# Patient Record
Sex: Male | Born: 1985 | Race: White | Hispanic: No | Marital: Married | Smoking: Never smoker
Health system: Southern US, Community
[De-identification: ages and names within clinical notes are randomized; demographics above are authoritative.]

---

## 2017-01-03 ENCOUNTER — Encounter (HOSPITAL_COMMUNITY): Payer: Self-pay

## 2017-01-03 ENCOUNTER — Emergency Department (HOSPITAL_COMMUNITY): Payer: BLUE CROSS/BLUE SHIELD

## 2017-01-03 ENCOUNTER — Emergency Department (HOSPITAL_COMMUNITY)
Admission: EM | Admit: 2017-01-03 | Discharge: 2017-01-03 | Disposition: A | Discharge status: Home / Self Care | Payer: BLUE CROSS/BLUE SHIELD | Attending: Emergency Medicine | Admitting: Emergency Medicine

## 2017-01-03 DIAGNOSIS — N23 Unspecified renal colic: Secondary | ICD-10-CM | POA: Diagnosis not present

## 2017-01-03 DIAGNOSIS — N50812 Left testicular pain: Secondary | ICD-10-CM | POA: Insufficient documentation

## 2017-01-03 DIAGNOSIS — R1032 Left lower quadrant pain: Secondary | ICD-10-CM | POA: Diagnosis present

## 2017-01-03 LAB — COMPREHENSIVE METABOLIC PANEL
ALBUMIN: 4.3 g/dL (ref 3.5–5.0)
ALK PHOS: 52 U/L (ref 38–126)
ALT: 15 U/L — AB (ref 17–63)
AST: 19 U/L (ref 15–41)
Anion gap: 11 (ref 5–15)
BUN: 9 mg/dL (ref 6–20)
CHLORIDE: 103 mmol/L (ref 101–111)
CO2: 24 mmol/L (ref 22–32)
CREATININE: 1.23 mg/dL (ref 0.61–1.24)
Calcium: 8.8 mg/dL — ABNORMAL LOW (ref 8.9–10.3)
GFR calc non Af Amer: >60 mL/min (ref >60)
GLUCOSE: 127 mg/dL — AB (ref 65–99)
Potassium: 3.7 mmol/L (ref 3.5–5.1)
SODIUM: 138 mmol/L (ref 135–145)
Total Bilirubin: 0.8 mg/dL (ref 0.3–1.2)
Total Protein: 7.2 g/dL (ref 6.5–8.1)

## 2017-01-03 LAB — CBC
HCT: 41.4 % (ref 39.0–52.0)
Hemoglobin: 14.4 g/dL (ref 13.0–17.0)
MCH: 32.1 pg (ref 26.0–34.0)
MCHC: 34.8 g/dL (ref 30.0–36.0)
MCV: 92.2 fL (ref 78.0–100.0)
PLATELETS: 234 10*3/uL (ref 150–400)
RBC: 4.49 MIL/uL (ref 4.22–5.81)
RDW: 13.4 % (ref 11.5–15.5)
WBC: 11.7 10*3/uL — ABNORMAL HIGH (ref 4.0–10.5)

## 2017-01-03 LAB — URINALYSIS, ROUTINE W REFLEX MICROSCOPIC
BILIRUBIN URINE: NEGATIVE
Glucose, UA: NEGATIVE mg/dL
KETONES UR: 20 mg/dL — AB
Leukocytes, UA: NEGATIVE
Nitrite: NEGATIVE
Protein, ur: NEGATIVE mg/dL
SPECIFIC GRAVITY, URINE: 1.026 (ref 1.005–1.030)
pH: 5 (ref 5.0–8.0)

## 2017-01-03 LAB — LIPASE, BLOOD: LIPASE: 27 U/L (ref 11–51)

## 2017-01-03 MED ORDER — TAMSULOSIN HCL 0.4 MG PO CAPS: 0.4000 mg | ORAL_CAPSULE | Freq: Every day | ORAL | 0 refills | Status: AC

## 2017-01-03 MED ORDER — ONDANSETRON HCL 4 MG PO TABS: 4.0000 mg | ORAL_TABLET | Freq: Four times a day (QID) | ORAL | 0 refills | Status: AC

## 2017-01-03 MED ORDER — OXYCODONE-ACETAMINOPHEN 5-325 MG PO TABS: 1.0000 | ORAL_TABLET | ORAL | 0 refills | Status: AC | PRN

## 2017-01-03 MED ORDER — KETOROLAC TROMETHAMINE 10 MG PO TABS: 10.0000 mg | ORAL_TABLET | Freq: Four times a day (QID) | ORAL | 0 refills | Status: AC | PRN

## 2017-01-03 MED ORDER — KETOROLAC TROMETHAMINE 30 MG/ML IJ SOLN
30.0000 mg | Freq: Once | INTRAMUSCULAR | Status: AC
  Administered 2017-01-03: 30 mg via INTRAVENOUS
  Filled 2017-01-03: qty 1

## 2017-01-03 NOTE — ED Triage Notes (Signed)
Pt complaining of L testicular pain that radiates to L lower abdomin. Pt denies any injury/trauma. Pt states 8 episodes of emesis tonight. Pt denies any swelling, states some tenderness.

## 2017-01-03 NOTE — ED Provider Notes (Signed)
[facility] Provider Note   CSN: [id number] Arrival date & time: 01/03/17  0154  By signing my name below, I, Elder Negus, attest that this documentation has been prepared under the direction and in the presence of Susi Goslin, Canary Brim, MD. Electronically Signed: Elder Negus, Scribe. 01/03/17. 3:34 AM.   History   Chief Complaint Chief Complaint  Patient presents with  . Testicle Pain  . Abdominal Pain    HPI Eugene Powell is a 31 y.o. male without chronic medical problems who presents to the ED with testicular and abdominal pain. This patient states that approximately 2-3 hours ago he developed L testicular pain which extends into his LLQ and L flank. No trauma. He has had several instances of vomiting pre-hospital. No history of nephrolithiasis. No dysuria or hematuria. Pain fluctuates when walking, certain movements.  The history is provided by the patient. No language interpreter was used.  Testicle Pain  This is a new problem. The current episode started 1 to 2 hours ago. The problem occurs constantly. The problem has not changed since onset.Associated symptoms include abdominal pain.    History reviewed. No pertinent past medical history.  There are no active problems to display for this patient.   History reviewed. No pertinent surgical history.     Home Medications    Prior to Admission medications   Medication Sig Start Date End Date Taking? Authorizing Provider  ketorolac (TORADOL) 10 MG tablet Take 1 tablet (10 mg total) by mouth every 6 (six) hours as needed. 01/03/17   Gilda Crease, MD  ondansetron (ZOFRAN) 4 MG tablet Take 1 tablet (4 mg total) by mouth every 6 (six) hours. 01/03/17   Gilda Crease, MD  oxyCODONE-acetaminophen (PERCOCET) 5-325 MG tablet Take 1-2 tablets by mouth every 4 (four) hours as needed. 01/03/17   Gilda Crease, MD  tamsulosin (FLOMAX) 0.4 MG CAPS capsule Take 1 capsule (0.4 mg total) by  mouth daily. 01/03/17   Gilda Crease, MD    Family History History reviewed. No pertinent family history.  Social History Social History  Substance Use Topics  . Smoking status: Never Smoker  . Smokeless tobacco: Never Used  . Alcohol use Yes     Allergies   Codeine   Review of Systems Review of Systems  Gastrointestinal: Positive for abdominal pain.  Genitourinary: Positive for testicular pain.     Physical Exam Updated Vital Signs BP 131/80 (BP Location: Right Arm)   Pulse 70   Temp 98 F (36.7 C) (Oral)   Ht 5\' 10"  (1.778 m)   Wt 245 lb (111.1 kg)   SpO2 100%   BMI 35.15 kg/m   Physical Exam  Constitutional: He is oriented to person, place, and time. He appears well-developed and well-nourished. No distress.  HENT:  Head: Normocephalic and atraumatic.  Right Ear: Hearing normal.  Left Ear: Hearing normal.  Nose: Nose normal.  Mouth/Throat: Oropharynx is clear and moist and mucous membranes are normal.  Eyes: Conjunctivae and EOM are normal. Pupils are equal, round, and reactive to light.  Neck: Normal range of motion. Neck supple.  Cardiovascular: Regular rhythm, S1 normal and S2 normal.  Exam reveals no gallop and no friction rub.   No murmur heard. Pulmonary/Chest: Effort normal and breath sounds normal. No respiratory distress. He exhibits no tenderness.  Abdominal: Soft. Normal appearance and bowel sounds are normal. There is no hepatosplenomegaly. There is no rebound, no guarding, no tenderness at McBurney's point and negative Murphy's sign. No  hernia.  Slight LLQ tenderness.  Musculoskeletal: Normal range of motion.  L CVA tenderness.  Neurological: He is alert and oriented to person, place, and time. He has normal strength. No cranial nerve deficit or sensory deficit. Coordination normal. GCS eye subscore is 4. GCS verbal subscore is 5. GCS motor subscore is 6.  Skin: Skin is warm, dry and intact. No rash noted. No cyanosis.  Psychiatric: He  has a normal mood and affect. His speech is normal and behavior is normal. Thought content normal.  Nursing note and vitals reviewed.    ED Treatments / Results  Labs (all labs ordered are listed, but only abnormal results are displayed) Labs Reviewed  COMPREHENSIVE METABOLIC PANEL - Abnormal; Notable for the following:       Result Value   Glucose, Bld 127 (*)    Calcium 8.8 (*)    ALT 15 (*)    All other components within normal limits  CBC - Abnormal; Notable for the following:    WBC 11.7 (*)    All other components within normal limits  URINALYSIS, ROUTINE W REFLEX MICROSCOPIC - Abnormal; Notable for the following:    Hgb urine dipstick MODERATE (*)    Ketones, ur 20 (*)    Bacteria, UA RARE (*)    Squamous Epithelial / LPF 0-5 (*)    All other components within normal limits  LIPASE, BLOOD    EKG  EKG Interpretation None       Radiology Ct Renal Stone Study  Result Date: 01/03/2017 CLINICAL DATA:  Left flank pain. EXAM: CT ABDOMEN AND PELVIS WITHOUT CONTRAST TECHNIQUE: Multidetector CT imaging of the abdomen and pelvis was performed following the standard protocol without IV contrast. COMPARISON:  None. FINDINGS: Lower chest: Minimal dependent atelectasis. No consolidation or pleural fluid. Hepatobiliary: No focal liver abnormality is seen. No gallstones, gallbladder wall thickening, or biliary dilatation. Pancreas: No ductal dilatation or inflammation. Spleen: Normal in size without focal abnormality. Adrenals/Urinary Tract: Obstructing 3 mm stone at the left ureterovesicular junction with mild proximal hydroureteronephrosis and perinephric edema. Punctate nonobstructing stone in the lower left kidney. No stones or obstructive uropathy in the right kidney. Urinary bladder is completely decompressed. No adrenal nodule. Stomach/Bowel: Stomach is within normal limits. Appendix appears normal. No evidence of bowel wall thickening, distention, or inflammatory changes.  Vascular/Lymphatic: No significant vascular findings are present on noncontrast exam. No enlarged abdominal or pelvic lymph nodes. Reproductive: Prostate is unremarkable. Other: Fat within both inguinal canals, left greater than right. Tiny fat containing umbilical hernia. No free air, free fluid, or intra-abdominal fluid collection. Musculoskeletal: There are no acute or suspicious osseous abnormalities. IMPRESSION: Obstructing 3 mm stone at the left ureterovesicular junction with mild hydronephrosis. Additional punctate nonobstructing stone in the lower left kidney. Electronically Signed   By: Rubye OaksMelanie  Ehinger M.D.   On: 01/03/2017 04:43    Procedures Procedures (including critical care time)  Medications Ordered in ED Medications  ketorolac (TORADOL) 30 MG/ML injection 30 mg (not administered)     Initial Impression / Assessment and Plan / ED Course  I have reviewed the triage vital signs and the nursing notes.  Pertinent labs & imaging results that were available during my care of the patient were reviewed by me and considered in my medical decision making (see chart for details).     Patient presents to the emergency department for evaluation of left lower abdominal pain, left testicular pain, left lower back pain. He reports the pain started suddenly, causing  him nausea and vomiting. He is extremely uncomfortable, feels like pacing makes him feel a little bit better.  Blood work was unremarkable. Urinalysis did show microscopic hematuria. Examination and presentation was most consistent with ureterolithiasis, although torsion was considered. CT scan was ordered and does confirm distal ureteral stone confirming diagnosis of ureterolithiasis.  Final Clinical Impressions(s) / ED Diagnoses   Final diagnoses:  Renal colic on left side    New Prescriptions New Prescriptions   KETOROLAC (TORADOL) 10 MG TABLET    Take 1 tablet (10 mg total) by mouth every 6 (six) hours as needed.    ONDANSETRON (ZOFRAN) 4 MG TABLET    Take 1 tablet (4 mg total) by mouth every 6 (six) hours.   OXYCODONE-ACETAMINOPHEN (PERCOCET) 5-325 MG TABLET    Take 1-2 tablets by mouth every 4 (four) hours as needed.   TAMSULOSIN (FLOMAX) 0.4 MG CAPS CAPSULE    Take 1 capsule (0.4 mg total) by mouth daily.  I personally performed the services described in this documentation, which was scribed in my presence. The recorded information has been reviewed and is accurate.    Gilda Crease, MD 01/03/17 [id number]

## 2017-01-03 NOTE — ED Notes (Signed)
Patient transported to CT 

## 2017-12-15 IMAGING — CT CT RENAL STONE PROTOCOL
2 of 4 series · 17 of 46 positions shown, 19 images · non-contrast
Comparison: None.

CLINICAL DATA: Left flank pain.

EXAM:
CT ABDOMEN AND PELVIS WITHOUT CONTRAST
TECHNIQUE: Multidetector CT imaging of the abdomen and pelvis was performed
following the standard protocol without IV contrast.

[Series 3: stone study 5.0 i30f 2 · axial · 0.81mm/px · z∈[+863,+1328]mm · 14 of 103 slices shown, 16 images]
[im 5/103  soft-tissue]
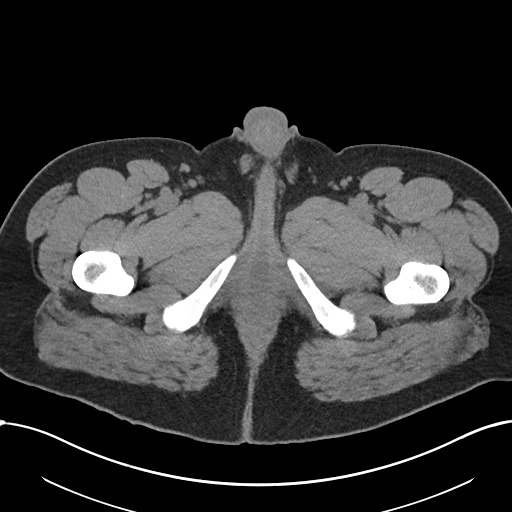
[im 5/103  bone]
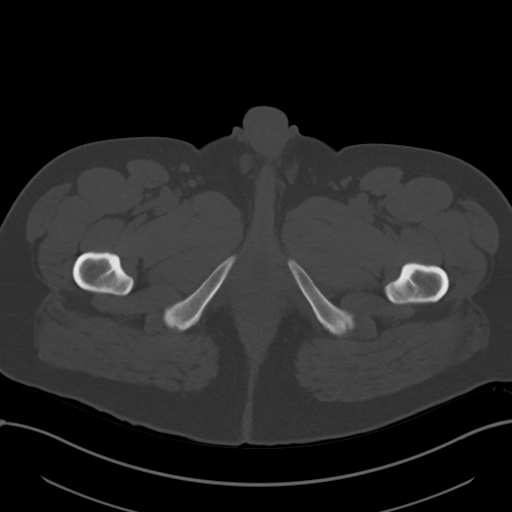
[im 13/103  soft-tissue]
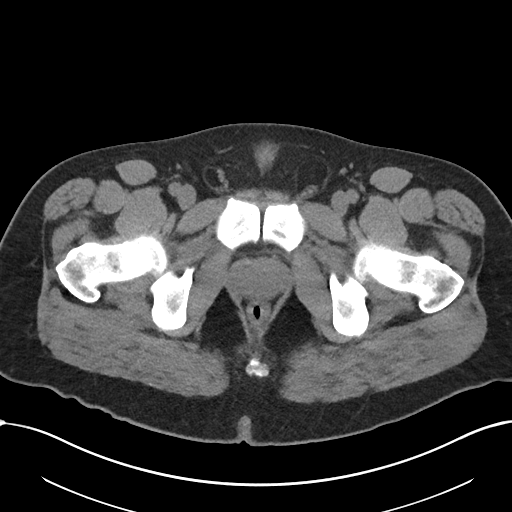
[im 21/103  soft-tissue]
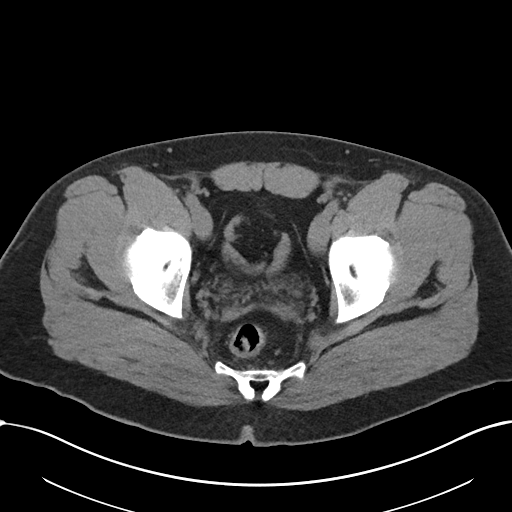
[im 29/103  soft-tissue]
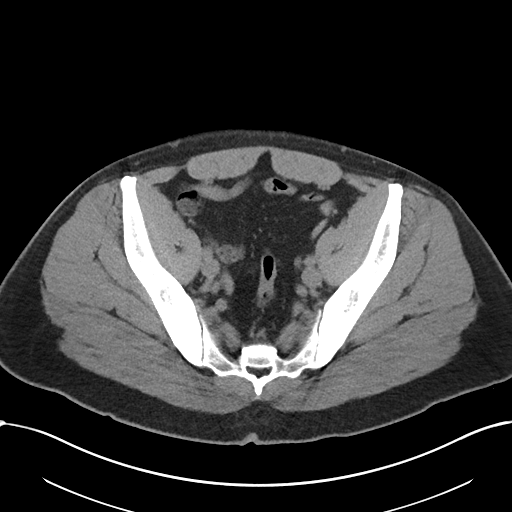
[im 33/103  soft-tissue]
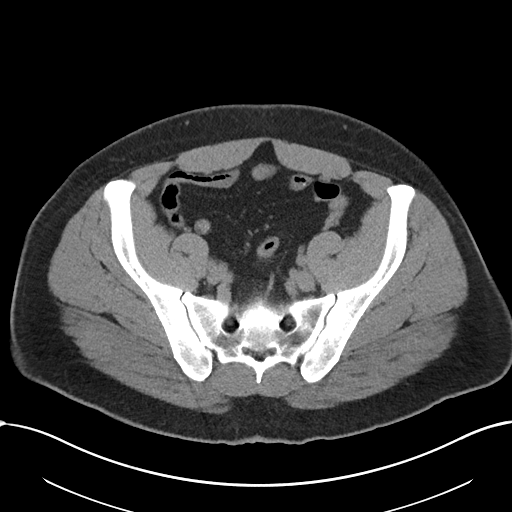
[im 41/103  soft-tissue]
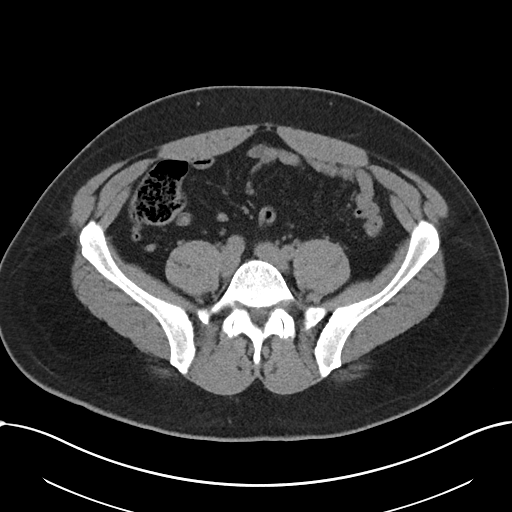
[im 49/103  soft-tissue]
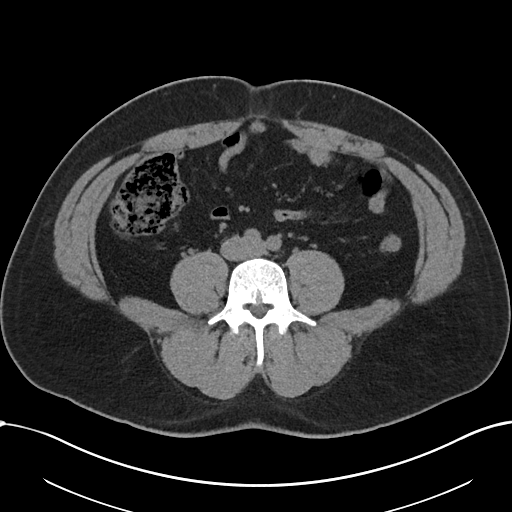
[im 54/103  soft-tissue]
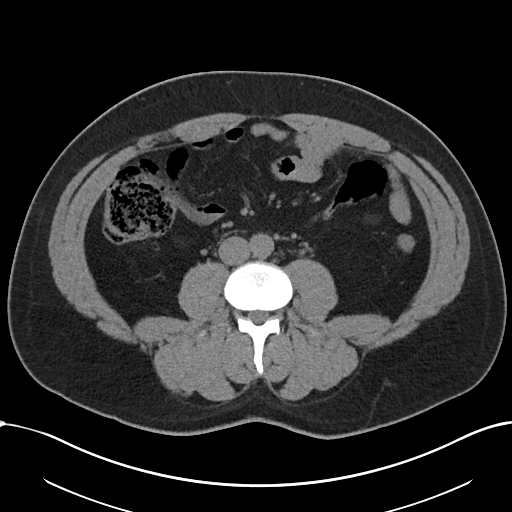
[im 62/103  soft-tissue]
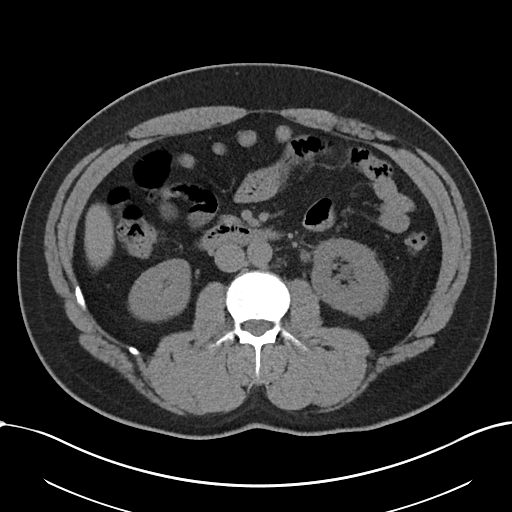
[im 62/103  bone]
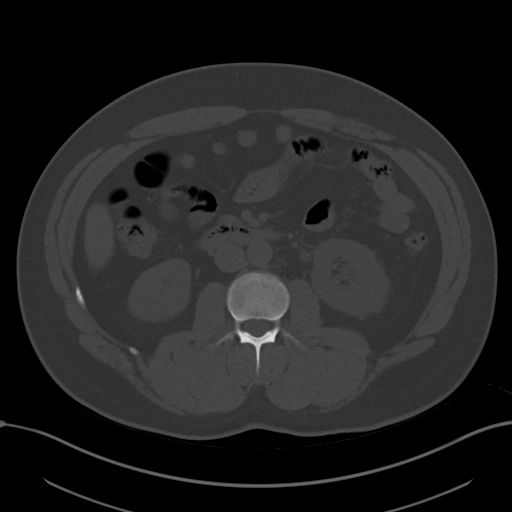
[im 70/103  soft-tissue]
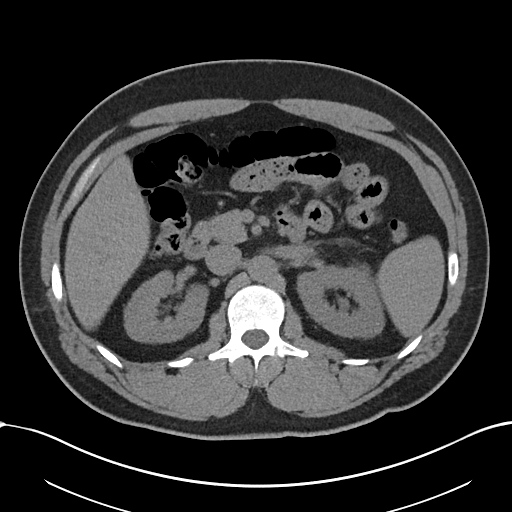
[im 78/103  soft-tissue]
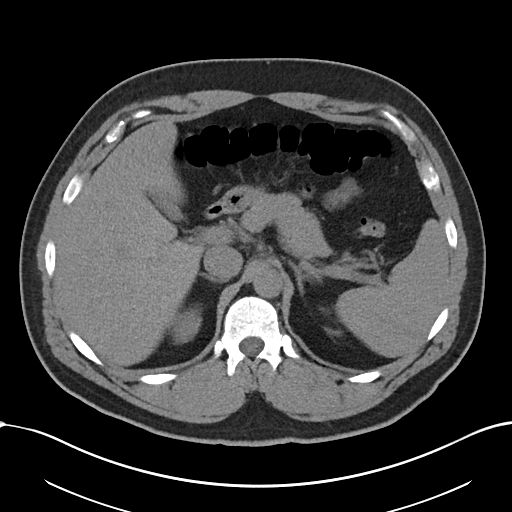
[im 82/103  soft-tissue]
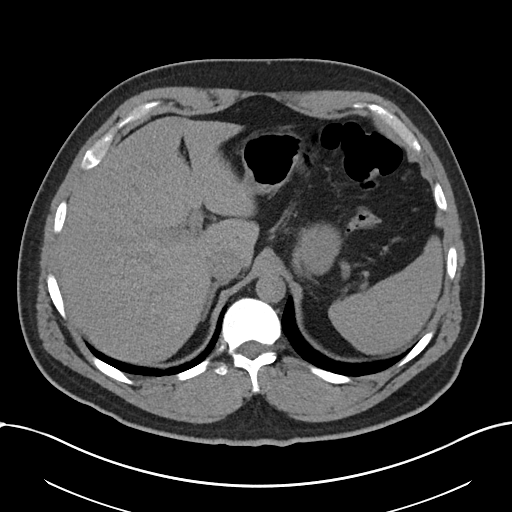
[im 90/103  soft-tissue]
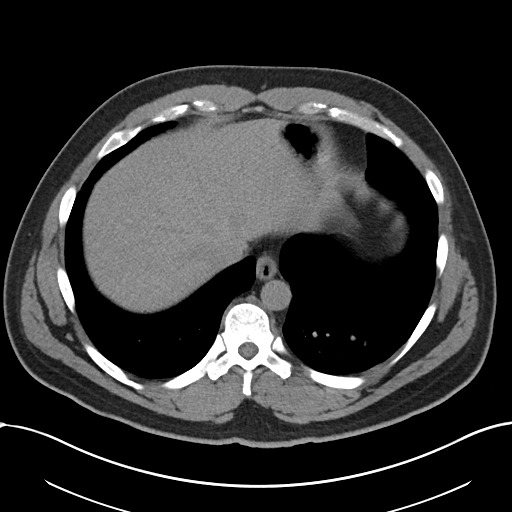
[im 98/103  soft-tissue]
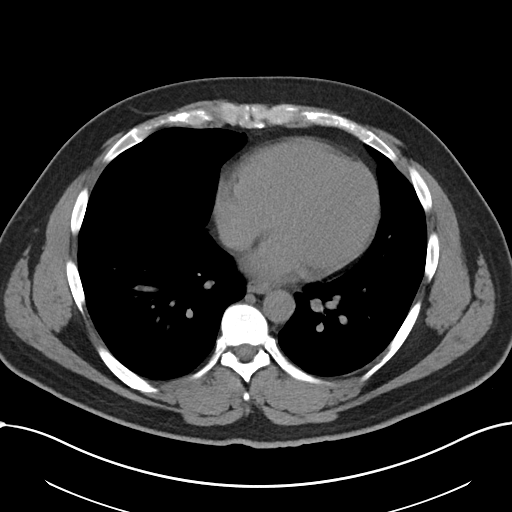

[Series 6: coronal soft tissue · coronal · 0.98mm/px · 3 of 101 slices shown]
[im 34/101  soft-tissue]
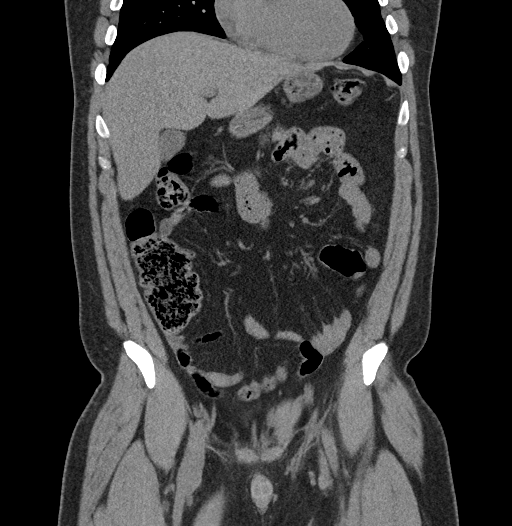
[im 45/101  soft-tissue]
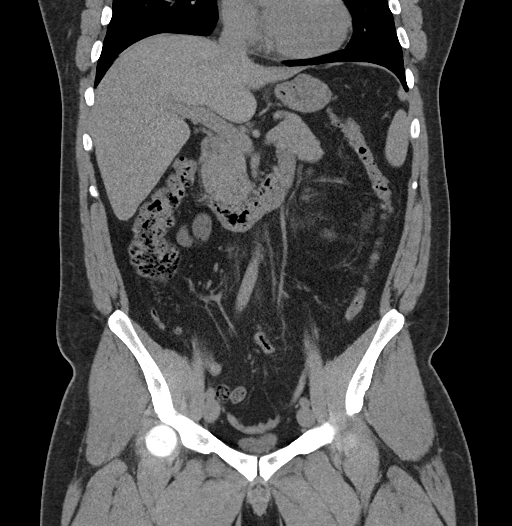
[im 56/101  soft-tissue]
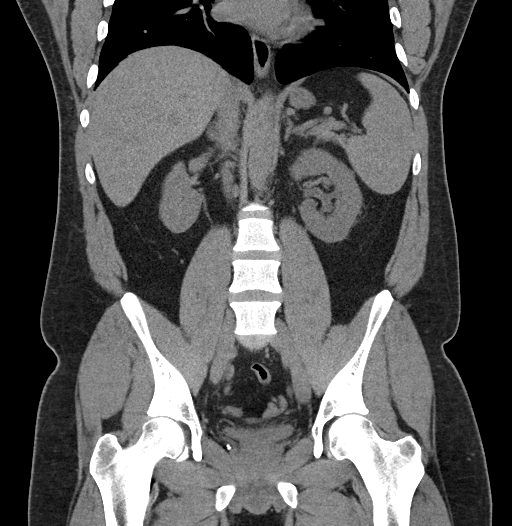

[17 of 46 positions shown; findings below may reference images not displayed]

FINDINGS: Lower chest: Minimal dependent atelectasis. No consolidation or
pleural fluid.

Hepatobiliary: No focal liver abnormality is seen. No gallstones,
gallbladder wall thickening, or biliary dilatation.

Pancreas: No ductal dilatation or inflammation.

Spleen: Normal in size without focal abnormality.

Adrenals/Urinary Tract: Obstructing 3 mm stone at the left
ureterovesicular junction with mild proximal hydroureteronephrosis
and perinephric edema. Punctate nonobstructing stone in the lower
left kidney. No stones or obstructive uropathy in the right kidney.
Urinary bladder is completely decompressed. No adrenal nodule.

Stomach/Bowel: Stomach is within normal limits. Appendix appears
normal. No evidence of bowel wall thickening, distention, or
inflammatory changes.

Vascular/Lymphatic: No significant vascular findings are present on
noncontrast exam. No enlarged abdominal or pelvic lymph nodes.

Reproductive: Prostate is unremarkable.

Other: Fat within both inguinal canals, left greater than right.
Tiny fat containing umbilical hernia. No free air, free fluid, or
intra-abdominal fluid collection.

Musculoskeletal: There are no acute or suspicious osseous
abnormalities.
IMPRESSION: Obstructing 3 mm stone at the left ureterovesicular junction with
mild hydronephrosis.

Additional punctate nonobstructing stone in the lower left kidney.

## 2020-01-16 ENCOUNTER — Encounter (HOSPITAL_COMMUNITY): Payer: Self-pay | Admitting: *Deleted

## 2020-01-16 ENCOUNTER — Other Ambulatory Visit: Payer: Self-pay

## 2020-01-16 ENCOUNTER — Emergency Department (HOSPITAL_COMMUNITY)
Admission: EM | Admit: 2020-01-16 | Discharge: 2020-01-16 | Disposition: A | Discharge status: Home / Self Care | Payer: BC Managed Care – PPO | Attending: Emergency Medicine | Admitting: Emergency Medicine

## 2020-01-16 DIAGNOSIS — T148XXA Other injury of unspecified body region, initial encounter: Secondary | ICD-10-CM | POA: Diagnosis present

## 2020-01-16 DIAGNOSIS — W208XXA Other cause of strike by thrown, projected or falling object, initial encounter: Secondary | ICD-10-CM | POA: Insufficient documentation

## 2020-01-16 DIAGNOSIS — Y929 Unspecified place or not applicable: Secondary | ICD-10-CM | POA: Diagnosis not present

## 2020-01-16 DIAGNOSIS — Y999 Unspecified external cause status: Secondary | ICD-10-CM | POA: Insufficient documentation

## 2020-01-16 DIAGNOSIS — Z5321 Procedure and treatment not carried out due to patient leaving prior to being seen by health care provider: Secondary | ICD-10-CM | POA: Diagnosis not present

## 2020-01-16 DIAGNOSIS — Y939 Activity, unspecified: Secondary | ICD-10-CM | POA: Diagnosis not present

## 2020-01-16 NOTE — ED Triage Notes (Signed)
Laceration just below the right knee, bleeding controlled, Laceration to the left lower leg, some oozing dressing applied, leg elevated in lobby.

## 2020-01-16 NOTE — ED Notes (Signed)
Patient wants to leave due to possible wait time. Consulting civil engineer notified. Pt encouraged to stay. Refuses. Pt leaves.

## 2020-01-16 NOTE — ED Triage Notes (Signed)
Pt arrives reporting a manhole cover flipped up, cutting the front of his lower legs, he fell in about calf deep. Unknown tetanus

## 2021-12-13 ENCOUNTER — Encounter: Payer: Self-pay | Admitting: Emergency Medicine

## 2021-12-13 ENCOUNTER — Ambulatory Visit
Admission: EM | Admit: 2021-12-13 | Discharge: 2021-12-13 | Disposition: A | Discharge status: Home / Self Care | Payer: BC Managed Care – PPO | Attending: Emergency Medicine | Admitting: Emergency Medicine

## 2021-12-13 DIAGNOSIS — J069 Acute upper respiratory infection, unspecified: Secondary | ICD-10-CM | POA: Diagnosis not present

## 2021-12-13 DIAGNOSIS — H109 Unspecified conjunctivitis: Secondary | ICD-10-CM | POA: Diagnosis not present

## 2021-12-13 LAB — POCT RAPID STREP A (OFFICE): Rapid Strep A Screen: NEGATIVE

## 2021-12-13 MED ORDER — BENZONATATE 100 MG PO CAPS: 100.0000 mg | ORAL_CAPSULE | Freq: Three times a day (TID) | ORAL | 0 refills | Status: AC | PRN

## 2021-12-13 MED ORDER — POLYMYXIN B-TRIMETHOPRIM 10000-0.1 UNIT/ML-% OP SOLN: 1.0000 [drp] | Freq: Four times a day (QID) | OPHTHALMIC | 0 refills | Status: AC

## 2021-12-13 NOTE — ED Triage Notes (Signed)
Pt here with cough x 4 days and left eye redness and irritation since this morning.  ?

## 2021-12-13 NOTE — ED Provider Notes (Signed)
?[facility] ? ? ? ?CSN: [id number] ?Arrival date & time: 12/13/21  1017 ? ? ?  ? ?History   ?Chief Complaint ?Chief Complaint  ?Patient presents with  ? Cough  ? Eye Problem  ? ? ?HPI ?Jackey LogeMatthew Buskirk is a 36 y.o. male.  Patient presents with 4-day history of cough productive of green mucus, nasal congestion, runny nose, postnasal drip, sore throat.  He has left eye redness and matting of his lashes since this morning.  He denies fever, rash, shortness of breath, vomiting, diarrhea, or other symptoms.  Treatment at home with DayQuil and NyQuil.  No pertinent medical history. ? ?The history is provided by the patient.  ? ?History reviewed. No pertinent past medical history. ? ?There are no problems to display for this patient. ? ? ?History reviewed. No pertinent surgical history. ? ? ? ? ?Home Medications   ? ?Prior to Admission medications   ?Medication Sig Start Date End Date Taking? Authorizing Provider  ?benzonatate (TESSALON) 100 MG capsule Take 1 capsule (100 mg total) by mouth 3 (three) times daily as needed for cough. 12/13/21  Yes Mickie Bailate, Natacia Chaisson H, NP  ?trimethoprim-polymyxin b (POLYTRIM) ophthalmic solution Place 1 drop into both eyes 4 (four) times daily for 7 days. 12/13/21 12/20/21 Yes Mickie Bailate, Temica Righetti H, NP  ?ketorolac (TORADOL) 10 MG tablet Take 1 tablet (10 mg total) by mouth every 6 (six) hours as needed. 01/03/17   Gilda CreasePollina, Christopher J, MD  ?ondansetron (ZOFRAN) 4 MG tablet Take 1 tablet (4 mg total) by mouth every 6 (six) hours. 01/03/17   Gilda CreasePollina, Christopher J, MD  ?oxyCODONE-acetaminophen (PERCOCET) 5-325 MG tablet Take 1-2 tablets by mouth every 4 (four) hours as needed. 01/03/17   Gilda CreasePollina, Christopher J, MD  ?tamsulosin (FLOMAX) 0.4 MG CAPS capsule Take 1 capsule (0.4 mg total) by mouth daily. 01/03/17   Gilda CreasePollina, Christopher J, MD  ? ? ?Family History ?History reviewed. No pertinent family history. ? ?Social History ?Social History  ? ?Tobacco Use  ? Smoking status: Never  ? Smokeless tobacco:  Never  ?Substance Use Topics  ? Alcohol use: Yes  ? Drug use: No  ? ? ? ?Allergies   ?Codeine ? ? ?Review of Systems ?Review of Systems  ?Constitutional:  Negative for chills and fever.  ?HENT:  Positive for congestion, postnasal drip, rhinorrhea and sore throat. Negative for ear pain.   ?Eyes:  Positive for discharge and redness. Negative for pain and visual disturbance.  ?Respiratory:  Positive for cough. Negative for shortness of breath.   ?Cardiovascular:  Negative for chest pain and palpitations.  ?Gastrointestinal:  Negative for diarrhea and vomiting.  ?Skin:  Negative for color change and rash.  ?All other systems reviewed and are negative. ? ? ?Physical Exam ?Triage Vital Signs ?ED Triage Vitals  ?Enc Vitals Group  ?   BP 12/13/21 1030 135/87  ?   Pulse Rate 12/13/21 1030 72  ?   Resp 12/13/21 1030 18  ?   Temp 12/13/21 1030 98.4 ?F (36.9 ?C)  ?   Temp src --   ?   SpO2 12/13/21 1030 96 %  ?   Weight --   ?   Height --   ?   Head Circumference --   ?   Peak Flow --   ?   Pain Score 12/13/21 1036 0  ?   Pain Loc --   ?   Pain Edu? --   ?   Excl. in GC? --   ? ?  No data found. ? ?Updated Vital Signs ?BP 135/87   Pulse 72   Temp 98.4 ?F (36.9 ?C)   Resp 18   SpO2 96%  ? ?Visual Acuity ?Right Eye Distance:   ?Left Eye Distance:   ?Bilateral Distance:   ? ?Right Eye Near:   ?Left Eye Near:    ?Bilateral Near:    ? ?Physical Exam ?Vitals and nursing note reviewed.  ?Constitutional:   ?   General: He is not in acute distress. ?   Appearance: He is well-developed. He is not ill-appearing.  ?HENT:  ?   Right Ear: Tympanic membrane normal.  ?   Left Ear: Tympanic membrane normal.  ?   Nose: Congestion and rhinorrhea present.  ?   Mouth/Throat:  ?   Mouth: Mucous membranes are moist.  ?   Pharynx: Oropharyngeal exudate and posterior oropharyngeal erythema present.  ?Eyes:  ?   General: Lids are normal. Vision grossly intact.     ?   Right eye: No discharge.     ?   Left eye: No discharge.  ?   Extraocular Movements:  Extraocular movements intact.  ?   Conjunctiva/sclera:  ?   Right eye: Right conjunctiva is not injected.  ?   Left eye: Left conjunctiva is injected.  ?   Pupils: Pupils are equal, round, and reactive to light.  ?Cardiovascular:  ?   Rate and Rhythm: Normal rate and regular rhythm.  ?   Heart sounds: Normal heart sounds.  ?Pulmonary:  ?   Effort: Pulmonary effort is normal. No respiratory distress.  ?   Breath sounds: Normal breath sounds.  ?Musculoskeletal:  ?   Cervical back: Neck supple.  ?Skin: ?   General: Skin is warm and dry.  ?Neurological:  ?   Mental Status: He is alert.  ?Psychiatric:     ?   Mood and Affect: Mood normal.     ?   Behavior: Behavior normal.  ? ? ? ?UC Treatments / Results  ?Labs ?(all labs ordered are listed, but only abnormal results are displayed) ?Labs Reviewed  ?POCT RAPID STREP A (OFFICE) - Normal  ? ? ?EKG ? ? ?Radiology ?No results found. ? ?Procedures ?Procedures (including critical care time) ? ?Medications Ordered in UC ?Medications - No data to display ? ?Initial Impression / Assessment and Plan / UC Course  ?I have reviewed the triage vital signs and the nursing notes. ? ?Pertinent labs & imaging results that were available during my care of the patient were reviewed by me and considered in my medical decision making (see chart for details). ? ?  ?Left eye conjunctivitis, viral URI with cough.  Rapid strep negative.  Treating conjunctivitis with Polytrim eyedrops.  Treating cough with Tessalon Perles.  Discussed other symptomatic care, including Tylenol or ibuprofen and plain Mucinex.  Instructed patient to follow-up with his PCP if his symptoms are not improving.  He agrees to plan of care. ? ? ?Final Clinical Impressions(s) / UC Diagnoses  ? ?Final diagnoses:  ?Conjunctivitis of left eye, unspecified conjunctivitis type  ?Viral URI with cough  ? ? ? ?Discharge Instructions   ? ?  ?Use the antibiotic eyedrops as prescribed.   ? ?Your strep test is negative.   ? ?Take the  [facility] as needed for cough.  Continue symptomatic treatment of your upper respiratory symptoms, including Tylenol or ibuprofen as needed for fever or discomfort and plain Mucinex as needed for congestion.   ? ?Follow up  with your primary care provider if your symptoms are not improving.   ? ? ? ? ? ? ?ED Prescriptions   ? ? Medication Sig Dispense Auth. Provider  ? trimethoprim-polymyxin b (POLYTRIM) ophthalmic solution Place 1 drop into both eyes 4 (four) times daily for 7 days. 10 mL Mickie Bail, NP  ? benzonatate (TESSALON) 100 MG capsule Take 1 capsule (100 mg total) by mouth 3 (three) times daily as needed for cough. 21 capsule Mickie Bail, NP  ? ?  ? ?PDMP not reviewed this encounter. ?  ?Mickie Bail, NP ?12/13/21 1108 ? ?

## 2021-12-13 NOTE — Discharge Instructions (Addendum)
Use the antibiotic eyedrops as prescribed.   ? ?Your strep test is negative.   ? ?Take the [facility] as needed for cough.  Continue symptomatic treatment of your upper respiratory symptoms, including Tylenol or ibuprofen as needed for fever or discomfort and plain Mucinex as needed for congestion.   ? ?Follow up with your primary care provider if your symptoms are not improving.   ? ? ?
